# Patient Record
Sex: Female | Born: 2005 | Race: White | Hispanic: No | Marital: Single | Smoking: Never smoker
Health system: Southern US, Community
[De-identification: ages and names within clinical notes are randomized; demographics above are authoritative.]

---
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
## 2024-01-29 NOTE — Progress Notes (Signed)
 " Cardiology Office Note:    Date:  02/04/2024   ID:  Carla Fields, DOB 04-06-2005, MRN [id number]  PCP:  Chrystal Lamarr RAMAN, MD   [facility] Providers Cardiologist:  None     Referring MD: Chrystal Lamarr RAMAN, MD   Chief Complaint  Patient presents with   Palpitations    History of Present Illness:    Carla Fields is a 18 y.o. female is seen at the request of Dr Chrystal for evaluation of tachycardia. Reports periods of tachycardia when she starts doing aerobic activity involving an incline. Does fine walking on flat. States this has been going on for a year. HR will jump up to 180-200 bpm. If she stops and rests will usually come down in 5-10 minutes. Feels this is limiting. Notes lightheadedness. No syncope. Does note some SOB. On some occasions notes she yawns a lot. She is generally active walking, going to the gym and riding horses. Seen by PCP and event monitor ordered. Results show NSR with sinus tachycardia. Average HR 84. Range 50-179. Very rare ectopy. Symptoms appear to correlate with sinus tachycardia.   History reviewed. No pertinent past medical history.  History reviewed. No pertinent surgical history.  Current Medications: Active Medications[1]   Allergies:   Morphine    Social History   Socioeconomic History   Marital status: Single    Spouse name: Not on file   Number of children: Not on file   Years of education: Not on file   Highest education level: Not on file  Occupational History   Not on file  Tobacco Use   Smoking status: Never   Smokeless tobacco: Never  Substance and Sexual Activity   Alcohol use: Not on file   Drug use: Not on file   Sexual activity: Not on file  Other Topics Concern   Not on file  Social History Narrative   Student at [employer]   Social Drivers of Health   Tobacco Use: Low Risk (02/04/2024)   Patient History    Smoking Tobacco Use: Never    Smokeless Tobacco Use: Never    Passive Exposure:  Not on file  Financial Resource Strain: Low Risk (03/02/2022)   Received from [facility]   Overall Financial Resource Strain (CARDIA)    Difficulty of Paying Living Expenses: Not hard at all  Food Insecurity: No Food Insecurity (03/02/2022)   Received from [facility]   Epic    Within the past 12 months, you worried that your food would run out before you got the money to buy more.: Never true    Within the past 12 months, the food you bought just didn't last and you didn't have money to get more.: Never true  Transportation Needs: No Transportation Needs (03/02/2022)   Received from [facility] - Transportation    Lack of Transportation (Non-Medical): No    Lack of Transportation (Medical): No  Physical Activity: Not on file  Stress: Patient Declined (03/02/2022)   Received from [facility] - Occupational Stress Questionnaire    Feeling of Stress : Patient declined  Social Connections: Not on file  Depression (PHQ2-9): Not on file  Alcohol Screen: Not on file  Housing: Not on file  Utilities: Not At Risk (03/02/2022)   Received from [facility] Utilities    Threatened with loss of utilities: No  Health Literacy: Not on file     Family  History: The patient's family history includes Hypertension in her mother; Stroke in her maternal grandfather.  ROS:   Please see the history of present illness.     All other systems reviewed and are negative.  EKGs/Labs/Other Studies Reviewed:    The following studies were reviewed today: EKG Interpretation Date/Time:  Monday February 04 2024 14:20:45 EST Ventricular Rate:  85 PR Interval:  130 QRS Duration:  88 QT Interval:  372 QTC Calculation: 442 R Axis:   67  Text Interpretation: Normal sinus rhythm Normal ECG When compared with ECG of 14-Sep-2020 17:42, No significant change was found Confirmed by Mikaila Grunert [phone]) on 02/04/2024 2:32:37 PM    Recent Labs: No  results found for requested labs within last 365 days.  Recent Lipid Panel No results found for: CHOL, TRIG, HDL, CHOLHDL, VLDL, LDLCALC, LDLDIRECT Dated 12/12/23: normal CBC, CMET and TSH.   Risk Assessment/Calculations:                Physical Exam:    VS:  BP 100/74 (BP Location: Left Arm, Patient Position: Sitting, Cuff Size: Normal)   Pulse 85   Ht 5' 4 (1.626 m)   Wt 165 lb 4.8 oz (75 kg)   SpO2 98%   BMI 28.37 kg/m     Wt Readings from Last 3 Encounters:  02/04/24 165 lb 4.8 oz (75 kg) (90%, Z= 1.27)*  01/04/22 141 lb 1.5 oz (64 kg) (77%, Z= 0.74)*  09/14/20 142 lb 6.7 oz (64.6 kg) (81%, Z= 0.89)*   * Growth percentiles are based on CDC (Girls, 2-20 Years) data.     GEN:  Well nourished, well developed in no acute distress HEENT: Normal NECK: No JVD; No carotid bruits LYMPHATICS: No lymphadenopathy CARDIAC: RRR, no murmurs, rubs, gallops RESPIRATORY:  Clear to auscultation without rales, wheezing or rhonchi  ABDOMEN: Soft, non-tender, non-distended MUSCULOSKELETAL:  No edema; No deformity  SKIN: Warm and dry NEUROLOGIC:  Alert and oriented x 3 PSYCHIATRIC:  Normal affect   ASSESSMENT:    1. Inappropriate sinus tachycardia    PLAN:    In order of problems listed above:  No clear reason for tachycardia. Since she does have symptoms with this including dyspnea will check echo. Will trial Toprol  XL 25 mg daily and see if this helps with her symptoms.            Medication Adjustments/Labs and Tests Ordered: Current medicines are reviewed at length with the patient today.  Concerns regarding medicines are outlined above.  Orders Placed This Encounter  Procedures   EKG 12-Lead   No orders of the defined types were placed in this encounter.   Patient Instructions  Medication Instructions:  Continue same medications   Lab Work: None ordered  Testing/Procedures: None ordered  Follow-Up: At [facility], you and your  health needs are our priority.  As part of our continuing mission to provide you with exceptional heart care, our providers are all part of one team.  This team includes your primary Cardiologist (physician) and Advanced Practice Providers or APPs (Physician Assistants and Nurse Practitioners) who all work together to provide you with the care you need, when you need it.  Your next appointment:  To Be Determined     Provider:  Dr.Jovanna Hodges    We recommend signing up for the patient portal called MyChart.  Sign up information is provided on this After Visit Summary.  MyChart is used to connect with patients for Virtual Visits (Telemedicine).  Patients are able to view lab/test results, encounter notes, upcoming appointments, etc.  Non-urgent messages can be sent to your provider as well.   To learn more about what you can do with MyChart, go to [email or web address].      Signed, Bridget Dutton, MD  02/04/2024 2:51 PM    [facility]     [1]  Current Meds  Medication Sig   norgestimate-ethinyl estradiol (ORTHO-CYCLEN) 0.25-35 MG-MCG tablet Take 1 tablet by mouth daily.   sertraline (ZOLOFT) 100 MG tablet Take 100 mg by mouth daily.   "

## 2024-02-04 ENCOUNTER — Ambulatory Visit: Admitting: Cardiology

## 2024-02-04 ENCOUNTER — Encounter: Payer: Self-pay | Admitting: Cardiology

## 2024-02-04 VITALS — BP 100/74 | HR 85 | Ht 64.0 in | Wt 165.3 lb

## 2024-02-04 DIAGNOSIS — I4711 Inappropriate sinus tachycardia, so stated: Secondary | ICD-10-CM

## 2024-02-04 MED ORDER — METOPROLOL SUCCINATE ER 25 MG PO TB24: 25.0000 mg | ORAL_TABLET | Freq: Every day | ORAL | 3 refills | Status: AC

## 2024-02-04 NOTE — Patient Instructions (Addendum)
 Medication Instructions:  Start Toprol  XL 25 mg daily   Lab Work: None ordered  Testing/Procedures: Echo  first available  Follow-Up: At [facility], you and your health needs are our priority.  As part of our continuing mission to provide you with exceptional heart care, our providers are all part of one team.  This team includes your primary Cardiologist (physician) and Advanced Practice Providers or APPs (Physician Assistants and Nurse Practitioners) who all work together to provide you with the care you need, when you need it.  Your next appointment: After Echo    Provider:  Dr.Jordan    We recommend signing up for the patient portal called MyChart.  Sign up information is provided on this After Visit Summary.  MyChart is used to connect with patients for Virtual Visits (Telemedicine).  Patients are able to view lab/test results, encounter notes, upcoming appointments, etc.  Non-urgent messages can be sent to your provider as well.   To learn more about what you can do with MyChart, go to [email or web address].

## 2024-02-06 ENCOUNTER — Encounter: Payer: Self-pay | Admitting: Cardiology

## 2024-02-07 NOTE — Telephone Encounter (Signed)
 Called patient to discuss this message.Left message on personal voice mail to call me back.

## 2024-02-11 ENCOUNTER — Encounter: Payer: Self-pay | Admitting: Cardiology

## 2024-03-10 ENCOUNTER — Ambulatory Visit (HOSPITAL_COMMUNITY)
